# Patient Record
Sex: Male | Born: 1990 | Race: White | Hispanic: No | Marital: Single | Smoking: Never smoker
Health system: Southern US, Community
[De-identification: ages and names within clinical notes are randomized; demographics above are authoritative.]

---

## 2014-08-01 ENCOUNTER — Ambulatory Visit (HOSPITAL_COMMUNITY)
Admission: RE | Admit: 2014-08-01 | Discharge: 2014-08-01 | Disposition: A | Discharge status: Home / Self Care | Payer: 59 | Source: Ambulatory Visit | Attending: Internal Medicine | Admitting: Internal Medicine

## 2014-08-01 ENCOUNTER — Other Ambulatory Visit: Payer: Self-pay | Admitting: Internal Medicine

## 2014-08-01 ENCOUNTER — Other Ambulatory Visit (HOSPITAL_COMMUNITY): Payer: Self-pay | Admitting: Internal Medicine

## 2014-08-01 ENCOUNTER — Ambulatory Visit
Admission: RE | Admit: 2014-08-01 | Discharge: 2014-08-01 | Disposition: A | Discharge status: Home / Self Care | Payer: 59 | Source: Ambulatory Visit | Attending: Internal Medicine | Admitting: Internal Medicine

## 2014-08-01 DIAGNOSIS — R05 Cough: Secondary | ICD-10-CM

## 2014-08-01 DIAGNOSIS — R059 Cough, unspecified: Secondary | ICD-10-CM

## 2014-08-01 DIAGNOSIS — R509 Fever, unspecified: Secondary | ICD-10-CM | POA: Diagnosis not present

## 2014-08-04 ENCOUNTER — Emergency Department (HOSPITAL_COMMUNITY)
Admission: EM | Admit: 2014-08-04 | Discharge: 2014-08-04 | Disposition: A | Discharge status: Home / Self Care | Payer: 59 | Attending: Emergency Medicine | Admitting: Emergency Medicine

## 2014-08-04 ENCOUNTER — Emergency Department (HOSPITAL_COMMUNITY): Payer: 59

## 2014-08-04 ENCOUNTER — Encounter (HOSPITAL_COMMUNITY): Payer: Self-pay | Admitting: Emergency Medicine

## 2014-08-04 DIAGNOSIS — R0602 Shortness of breath: Secondary | ICD-10-CM

## 2014-08-04 DIAGNOSIS — R091 Pleurisy: Secondary | ICD-10-CM | POA: Insufficient documentation

## 2014-08-04 DIAGNOSIS — J189 Pneumonia, unspecified organism: Secondary | ICD-10-CM

## 2014-08-04 DIAGNOSIS — J188 Other pneumonia, unspecified organism: Secondary | ICD-10-CM | POA: Insufficient documentation

## 2014-08-04 LAB — BASIC METABOLIC PANEL
Anion gap: 13 (ref 5–15)
BUN: 27 mg/dL — ABNORMAL HIGH (ref 6–23)
CO2: 27 meq/L (ref 19–32)
Calcium: 9.1 mg/dL (ref 8.4–10.5)
Chloride: 99 mEq/L (ref 96–112)
Creatinine, Ser: 1.63 mg/dL — ABNORMAL HIGH (ref 0.50–1.35)
GFR calc Af Amer: 66 mL/min — ABNORMAL LOW (ref >90)
GFR calc non Af Amer: 57 mL/min — ABNORMAL LOW (ref >90)
GLUCOSE: 122 mg/dL — AB (ref 70–99)
Potassium: 4 mEq/L (ref 3.7–5.3)
Sodium: 139 mEq/L (ref 137–147)

## 2014-08-04 LAB — CBC WITH DIFFERENTIAL/PLATELET
Basophils Absolute: 0 10*3/uL (ref 0.0–0.1)
Basophils Relative: 0 % (ref 0–1)
Eosinophils Absolute: 0.4 10*3/uL (ref 0.0–0.7)
Eosinophils Relative: 4 % (ref 0–5)
HCT: 41.3 % (ref 39.0–52.0)
Hemoglobin: 14.4 g/dL (ref 13.0–17.0)
LYMPHS PCT: 12 % (ref 12–46)
Lymphs Abs: 1.3 10*3/uL (ref 0.7–4.0)
MCH: 29.8 pg (ref 26.0–34.0)
MCHC: 34.9 g/dL (ref 30.0–36.0)
MCV: 85.5 fL (ref 78.0–100.0)
Monocytes Absolute: 1 10*3/uL (ref 0.1–1.0)
Monocytes Relative: 9 % (ref 3–12)
NEUTROS PCT: 75 % (ref 43–77)
Neutro Abs: 7.9 10*3/uL — ABNORMAL HIGH (ref 1.7–7.7)
PLATELETS: 286 10*3/uL (ref 150–400)
RBC: 4.83 MIL/uL (ref 4.22–5.81)
RDW: 11.4 % — ABNORMAL LOW (ref 11.5–15.5)
WBC: 10.5 10*3/uL (ref 4.0–10.5)

## 2014-08-04 MED ORDER — ALBUTEROL SULFATE HFA 108 (90 BASE) MCG/ACT IN AERS: 2.0000 | INHALATION_SPRAY | RESPIRATORY_TRACT | Status: AC | PRN

## 2014-08-04 MED ORDER — ACETAMINOPHEN 500 MG PO TABS
1000.0000 mg | ORAL_TABLET | Freq: Once | ORAL | Status: AC
  Administered 2014-08-04: 1000 mg via ORAL
  Filled 2014-08-04: qty 2

## 2014-08-04 MED ORDER — SODIUM CHLORIDE 0.9 % IV BOLUS (SEPSIS)
1000.0000 mL | Freq: Once | INTRAVENOUS | Status: AC
  Administered 2014-08-04: 1000 mL via INTRAVENOUS

## 2014-08-04 MED ORDER — METHYLPREDNISOLONE SODIUM SUCC 125 MG IJ SOLR
125.0000 mg | Freq: Once | INTRAMUSCULAR | Status: AC
  Administered 2014-08-04: 125 mg via INTRAVENOUS
  Filled 2014-08-04: qty 2

## 2014-08-04 MED ORDER — KETOROLAC TROMETHAMINE 30 MG/ML IJ SOLN
30.0000 mg | Freq: Once | INTRAMUSCULAR | Status: AC
  Administered 2014-08-04: 30 mg via INTRAVENOUS
  Filled 2014-08-04: qty 1

## 2014-08-04 MED ORDER — PREDNISONE 20 MG PO TABS: ORAL_TABLET | ORAL | Status: AC

## 2014-08-04 MED ORDER — HYDROCODONE-HOMATROPINE 5-1.5 MG/5ML PO SYRP: 5.0000 mL | ORAL_SOLUTION | Freq: Four times a day (QID) | ORAL | Status: AC | PRN

## 2014-08-04 NOTE — ED Notes (Signed)
Patient transported to X-ray 

## 2014-08-04 NOTE — ED Notes (Signed)
Patient says it is okay to give his mother information.

## 2014-08-04 NOTE — ED Provider Notes (Signed)
CSN: [id number]     Arrival date & time 08/04/14  0358 History   First MD Initiated Contact with Patient 08/04/14 0415     Chief Complaint  Patient presents with  . Shortness of Breath  . Cough     (Consider location/radiation/quality/duration/timing/severity/associated sxs/prior Treatment) HPI Comments: Patient presents to the ER for evaluation of cough and chest pain. Patient reports that he was diagnosed with pneumonia one week ago. He was treated with Levaquin. Patient reports that overnight his cough has worsened and now he is having sharp stabbing pains in the right chest and rib area. He is having trouble taking deep breaths because of the pain. Pain significantly worsens if he coughs, takes a deep breath or moves.  Patient is a 23 y.o. male presenting with shortness of breath and cough.  Shortness of Breath Associated symptoms: chest pain and cough   Cough Associated symptoms: chest pain and shortness of breath     History reviewed. No pertinent past medical history. History reviewed. No pertinent past surgical history. No family history on file. History  Substance Use Topics  . Smoking status: Never Smoker   . Smokeless tobacco: Not on file  . Alcohol Use: No    Review of Systems  Respiratory: Positive for cough and shortness of breath.   Cardiovascular: Positive for chest pain.  All other systems reviewed and are negative.     Allergies  Review of patient's allergies indicates no known allergies.  Home Medications   Prior to Admission medications   Medication Sig Start Date End Date Taking? Authorizing Provider  ibuprofen (ADVIL,MOTRIN) 200 MG tablet Take 200 mg by mouth every 6 (six) hours as needed.   Yes Historical Provider, MD  levofloxacin (LEVAQUIN) 500 MG tablet Take 500 mg by mouth daily.   Yes Historical Provider, MD  albuterol (PROVENTIL HFA;VENTOLIN HFA) 108 (90 BASE) MCG/ACT inhaler Inhale 2 puffs into the lungs every 4 (four) hours as needed for  wheezing or shortness of breath. 08/04/14   Gilda Creasehristopher J. Baptiste Littler, MD  HYDROcodone-homatropine [facility](HYCODAN) 5-1.5 MG/5ML syrup Take 5 mLs by mouth every 6 (six) hours as needed for cough. 08/04/14   Gilda Creasehristopher J. Raydel Hosick, MD  predniSONE (DELTASONE) 20 MG tablet 3 tabs po daily x 3 days, then 2 tabs x 3 days, then 1.5 tabs x 3 days, then 1 tab x 3 days, then 0.5 tabs x 3 days 08/04/14   Gilda Creasehristopher J. Tarah Buboltz, MD   BP 106/54 mmHg  Pulse 86  Temp(Src) 99.5 F (37.5 C) (Oral)  Resp 20  Ht 5\' 11"  (1.803 m)  Wt 190 lb (86.183 kg)  BMI 26.51 kg/m2  SpO2 97% Physical Exam  Constitutional: He is oriented to person, place, and time. He appears well-developed and well-nourished. No distress.  HENT:  Head: Normocephalic and atraumatic.  Right Ear: Hearing normal.  Left Ear: Hearing normal.  Nose: Nose normal.  Mouth/Throat: Oropharynx is clear and moist and mucous membranes are normal.  Eyes: Conjunctivae and EOM are normal. Pupils are equal, round, and reactive to light.  Neck: Normal range of motion. Neck supple.  Cardiovascular: Regular rhythm, S1 normal and S2 normal.  Exam reveals no gallop and no friction rub.   No murmur heard. Pulmonary/Chest: Effort normal and breath sounds normal. No respiratory distress. He exhibits tenderness. He exhibits no crepitus.    Abdominal: Soft. Normal appearance and bowel sounds are normal. There is no hepatosplenomegaly. There is no tenderness. There is no rebound, no guarding, no tenderness at  McBurney's point and negative Murphy's sign. No hernia.  Musculoskeletal: Normal range of motion.  Neurological: He is alert and oriented to person, place, and time. He has normal strength. No cranial nerve deficit or sensory deficit. Coordination normal. GCS eye subscore is 4. GCS verbal subscore is 5. GCS motor subscore is 6.  Skin: Skin is warm, dry and intact. No rash noted. No cyanosis.  Psychiatric: He has a normal mood and affect. His speech is normal and  behavior is normal. Thought content normal.  Nursing note and vitals reviewed.   ED Course  Procedures (including critical care time) Labs Review Labs Reviewed  CBC WITH DIFFERENTIAL - Abnormal; Notable for the following:    RDW 11.4 (*)    Neutro Abs 7.9 (*)    All other components within normal limits  BASIC METABOLIC PANEL - Abnormal; Notable for the following:    Glucose, Bld 122 (*)    BUN 27 (*)    Creatinine, Ser 1.63 (*)    GFR calc non Af Amer 57 (*)    GFR calc Af Amer 66 (*)    All other components within normal limits    Imaging Review Dg Chest 2 View  08/04/2014   CLINICAL DATA:  Right chest pain.  Fever.  EXAM: CHEST  2 VIEW  COMPARISON:  PA and lateral chest 08/01/2014.  FINDINGS: Right middle lobe airspace disease identified as on the prior study but appears slightly less dense. The left lung is clear. Heart size is normal. No pneumothorax or pleural effusion.  IMPRESSION: Mild improvement in right middle lobe pneumonia. No new abnormality.   Electronically Signed   By: Drusilla Kannerhomas  Dalessio M.D.   On: 08/04/2014 04:55     EKG Interpretation None      MDM   Final diagnoses:  CAP (community acquired pneumonia)  Pleurisy   Patient presents to the ER for evaluation of right-sided chest pain, increasing cough and shortness of breath. Patient was diagnosed by his primary care doctor with pneumonia last week. He is on Levaquin for the pneumonia. Patient reports that he had acute worsening of his pain and cough last night. Patient is febrile at arrival. He reports that he has been experiencing fever every day for the last several days, since he was diagnosed.  Chest x-ray shows improving right middle lobe pneumonia. He appears to be responding to the Levaquin. Pain might be chest wall in nature or possibly pleurisy. He feels much better after Toradol and Solu-Medrol here in the ER. Will continue Levaquin. Add prednisone, albuterol, Hycodan. He has x-ray scheduled for next  week for recheck.    Gilda Creasehristopher J. Ace Bergfeld, MD 08/04/14 [phone]

## 2014-08-04 NOTE — ED Notes (Signed)
Patient has returned from xray 

## 2014-08-04 NOTE — ED Notes (Signed)
Pt. reports SOB with dry cough onset this morning with fever , currently taking oral antibiotic for pneumonia prescribed by his PCP this week .

## 2014-08-04 NOTE — ED Notes (Signed)
Patient is alert and orientedx4.  Patient was explained discharge instructions and they understood them with no questions.   

## 2014-08-04 NOTE — Discharge Instructions (Signed)
Pneumonia  Pneumonia is an infection of the lungs.   CAUSES  Pneumonia may be caused by bacteria or a virus. Usually, these infections are caused by breathing infectious particles into the lungs (respiratory tract).  SIGNS AND SYMPTOMS    Cough.   Fever.   Chest pain.   Increased rate of breathing.   Wheezing.   Mucus production.  DIAGNOSIS   If you have the common symptoms of pneumonia, your health care provider will typically confirm the diagnosis with a chest X-ray. The X-ray will show an abnormality in the lung (pulmonary infiltrate) if you have pneumonia. Other tests of your blood, urine, or sputum may be done to find the specific cause of your pneumonia. Your health care provider may also do tests (blood gases or pulse oximetry) to see how well your lungs are working.  TREATMENT   Some forms of pneumonia may be spread to other people when you cough or sneeze. You may be asked to wear a mask before and during your exam. Pneumonia that is caused by bacteria is treated with antibiotic medicine. Pneumonia that is caused by the influenza virus may be treated with an antiviral medicine. Most other viral infections must run their course. These infections will not respond to antibiotics.   HOME CARE INSTRUCTIONS    Cough suppressants may be used if you are losing too much rest. However, coughing protects you by clearing your lungs. You should avoid using cough suppressants if you can.   Your health care provider may have prescribed medicine if he or she thinks your pneumonia is caused by bacteria or influenza. Finish your medicine even if you start to feel better.   Your health care provider may also prescribe an expectorant. This loosens the mucus to be coughed up.   Take medicines only as directed by your health care provider.   Do not smoke. Smoking is a common cause of bronchitis and can contribute to pneumonia. If you are a smoker and continue to smoke, your cough may last several weeks after your  pneumonia has cleared.   A cold steam vaporizer or humidifier in your room or home may help loosen mucus.   Coughing is often worse at night. Sleeping in a semi-upright position in a recliner or using a couple pillows under your head will help with this.   Get rest as you feel it is needed. Your body will usually let you know when you need to rest.  PREVENTION  A pneumococcal shot (vaccine) is available to prevent a common bacterial cause of pneumonia. This is usually suggested for:   People over 65 years old.   Patients on chemotherapy.   People with chronic lung problems, such as bronchitis or emphysema.   People with immune system problems.  If you are over 65 or have a high risk condition, you may receive the pneumococcal vaccine if you have not received it before. In some countries, a routine influenza vaccine is also recommended. This vaccine can help prevent some cases of pneumonia.You may be offered the influenza vaccine as part of your care.  If you smoke, it is time to quit. You may receive instructions on how to stop smoking. Your health care provider can provide medicines and counseling to help you quit.  SEEK MEDICAL CARE IF:  You have a fever.  SEEK IMMEDIATE MEDICAL CARE IF:    Your illness becomes worse. This is especially true if you are elderly or weakened from any other disease.     You cannot control your cough with suppressants and are losing sleep.   You begin coughing up blood.   You develop pain which is getting worse or is uncontrolled with medicines.   Any of the symptoms which initially brought you in for treatment are getting worse rather than better.   You develop shortness of breath or chest pain.  MAKE SURE YOU:    Understand these instructions.   Will watch your condition.   Will get help right away if you are not doing well or get worse.  Document Released: 09/09/2005 Document Revised: 01/24/2014 Document Reviewed: 11/29/2010  [employer] Patient Information 2015  [employer]. This information is not intended to replace advice given to you by your health care provider. Make sure you discuss any questions you have with your health care provider.    Pleurisy  Pleurisy is an inflammation and swelling of the lining of the lungs (pleura). Because of this inflammation, it hurts to breathe. It can be aggravated by coughing, laughing, or deep breathing. Pleurisy is often caused by an underlying infection or disease.   HOME CARE INSTRUCTIONS   Monitor your pleurisy for any changes. The following actions may help to alleviate any discomfort you are experiencing:   Medicine may help with pain. Only take over-the-counter or prescription medicines for pain, discomfort, or fever as directed by your health care provider.   Only take antibiotic medicine as directed. Make sure to finish it even if you start to feel better.  SEEK MEDICAL CARE IF:    Your pain is not controlled with medicine or is increasing.   You have an increase in pus-like (purulent) secretions brought up with coughing.  SEEK IMMEDIATE MEDICAL CARE IF:    You have blue or dark lips, fingernails, or toenails.   You are coughing up blood.   You have increased difficulty breathing.   You have continuing pain unrelieved by medicine or pain lasting more than 1 week.   You have pain that radiates into your neck, arms, or jaw.   You develop increased shortness of breath or wheezing.   You develop a fever, rash, vomiting, fainting, or other serious symptoms.  MAKE SURE YOU:   Understand these instructions.    Will watch your condition.    Will get help right away if you are not doing well or get worse.    Document Released: 09/09/2005 Document Revised: 05/12/2013 Document Reviewed: 02/21/2013  [employer] Patient Information 2015 [employer]. This information is not intended to replace advice given to you by your health care provider. Make sure you discuss any questions you have with your health care  provider.

## 2014-08-08 ENCOUNTER — Other Ambulatory Visit (HOSPITAL_COMMUNITY): Payer: Self-pay | Admitting: Internal Medicine

## 2014-08-08 ENCOUNTER — Ambulatory Visit (HOSPITAL_COMMUNITY)
Admission: RE | Admit: 2014-08-08 | Discharge: 2014-08-08 | Disposition: A | Discharge status: Home / Self Care | Payer: 59 | Source: Ambulatory Visit | Attending: Internal Medicine | Admitting: Internal Medicine

## 2014-08-08 DIAGNOSIS — J189 Pneumonia, unspecified organism: Secondary | ICD-10-CM

## 2014-08-08 DIAGNOSIS — R05 Cough: Secondary | ICD-10-CM | POA: Diagnosis present

## 2014-08-08 DIAGNOSIS — R0989 Other specified symptoms and signs involving the circulatory and respiratory systems: Secondary | ICD-10-CM | POA: Insufficient documentation

## 2015-12-07 IMAGING — CR DG CHEST 2V
2 series · 2 of 2 positions shown · non-contrast
Comparison: None.

CLINICAL DATA: Fever and cough for 6 days

EXAM:
CHEST  2 VIEW

[w chest pa]
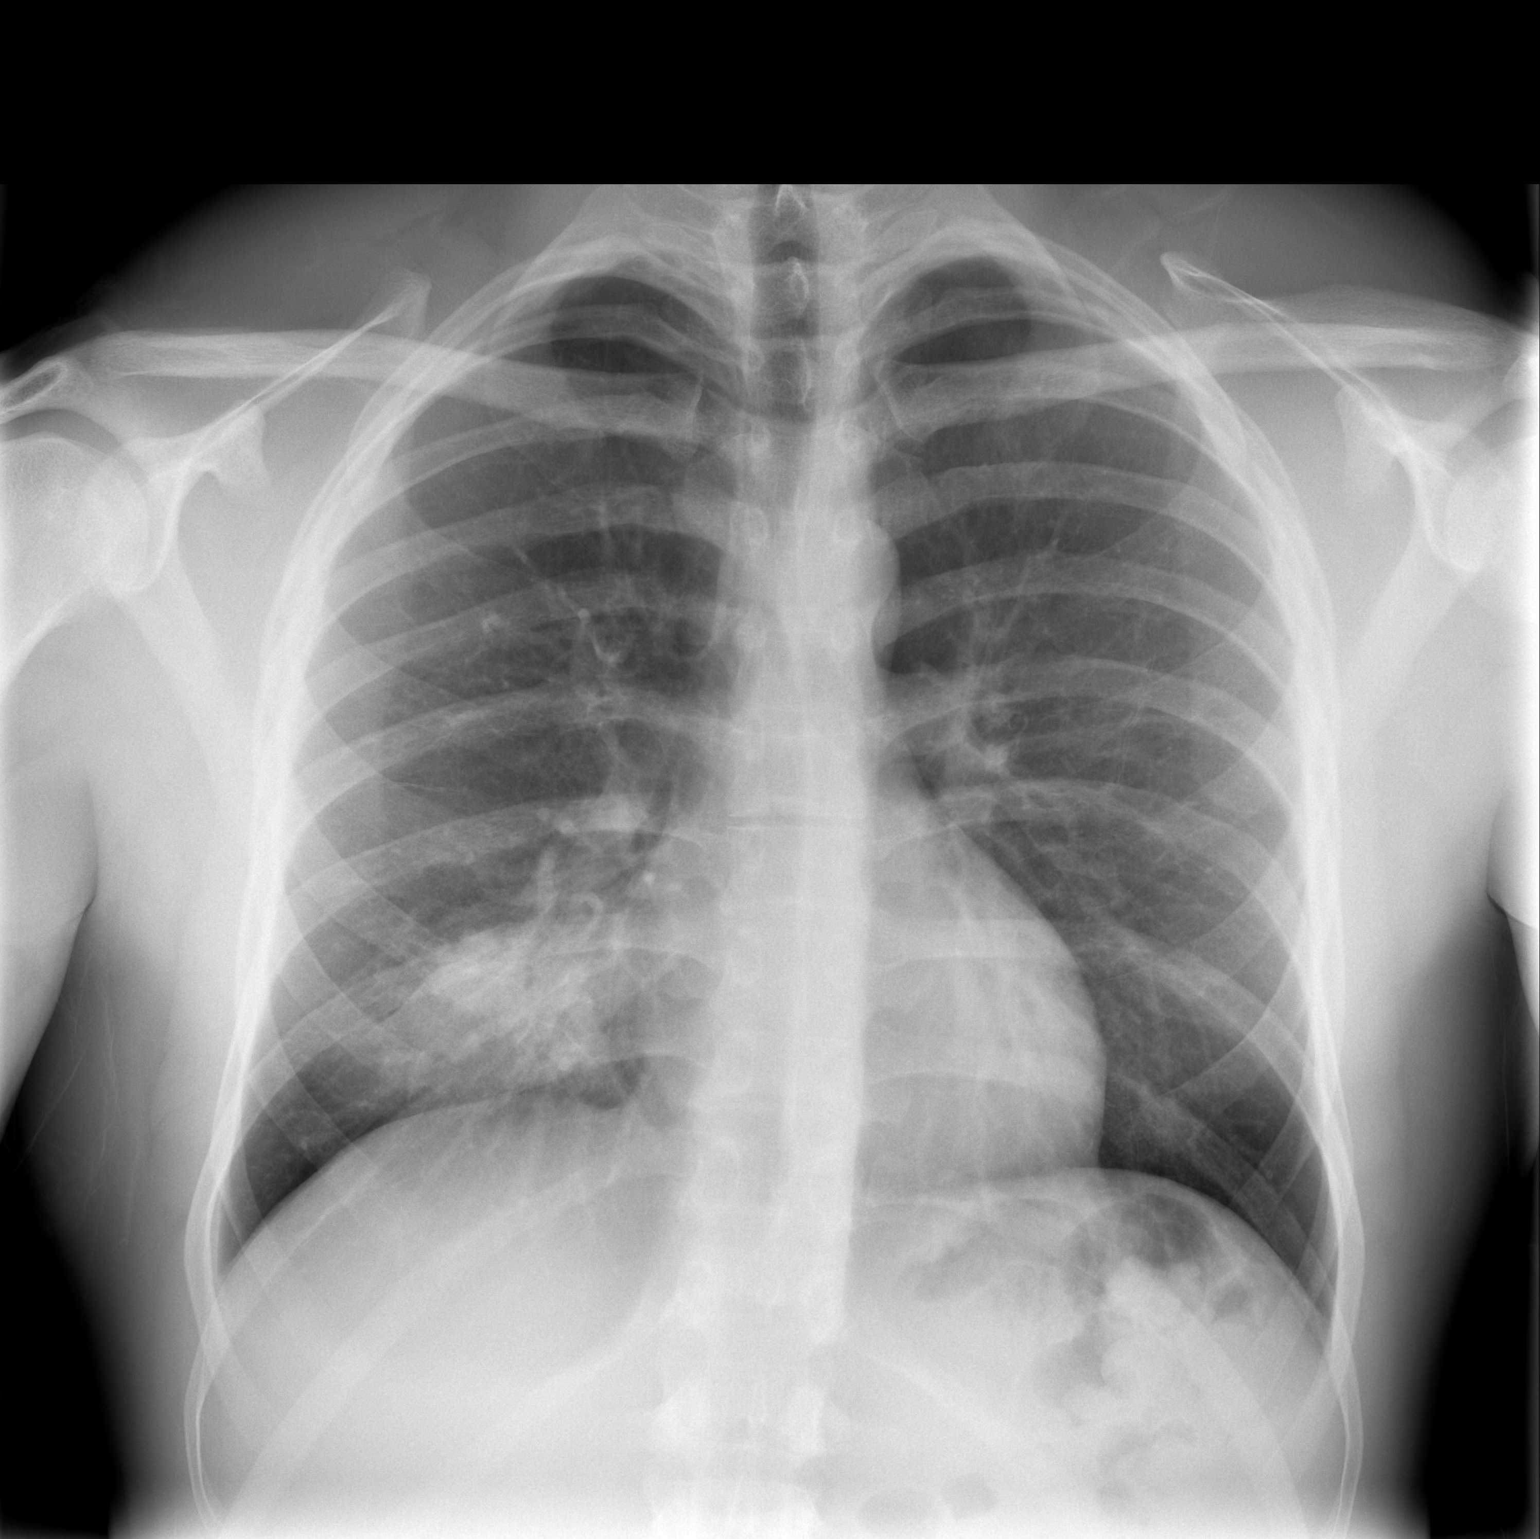

[w chest lat]
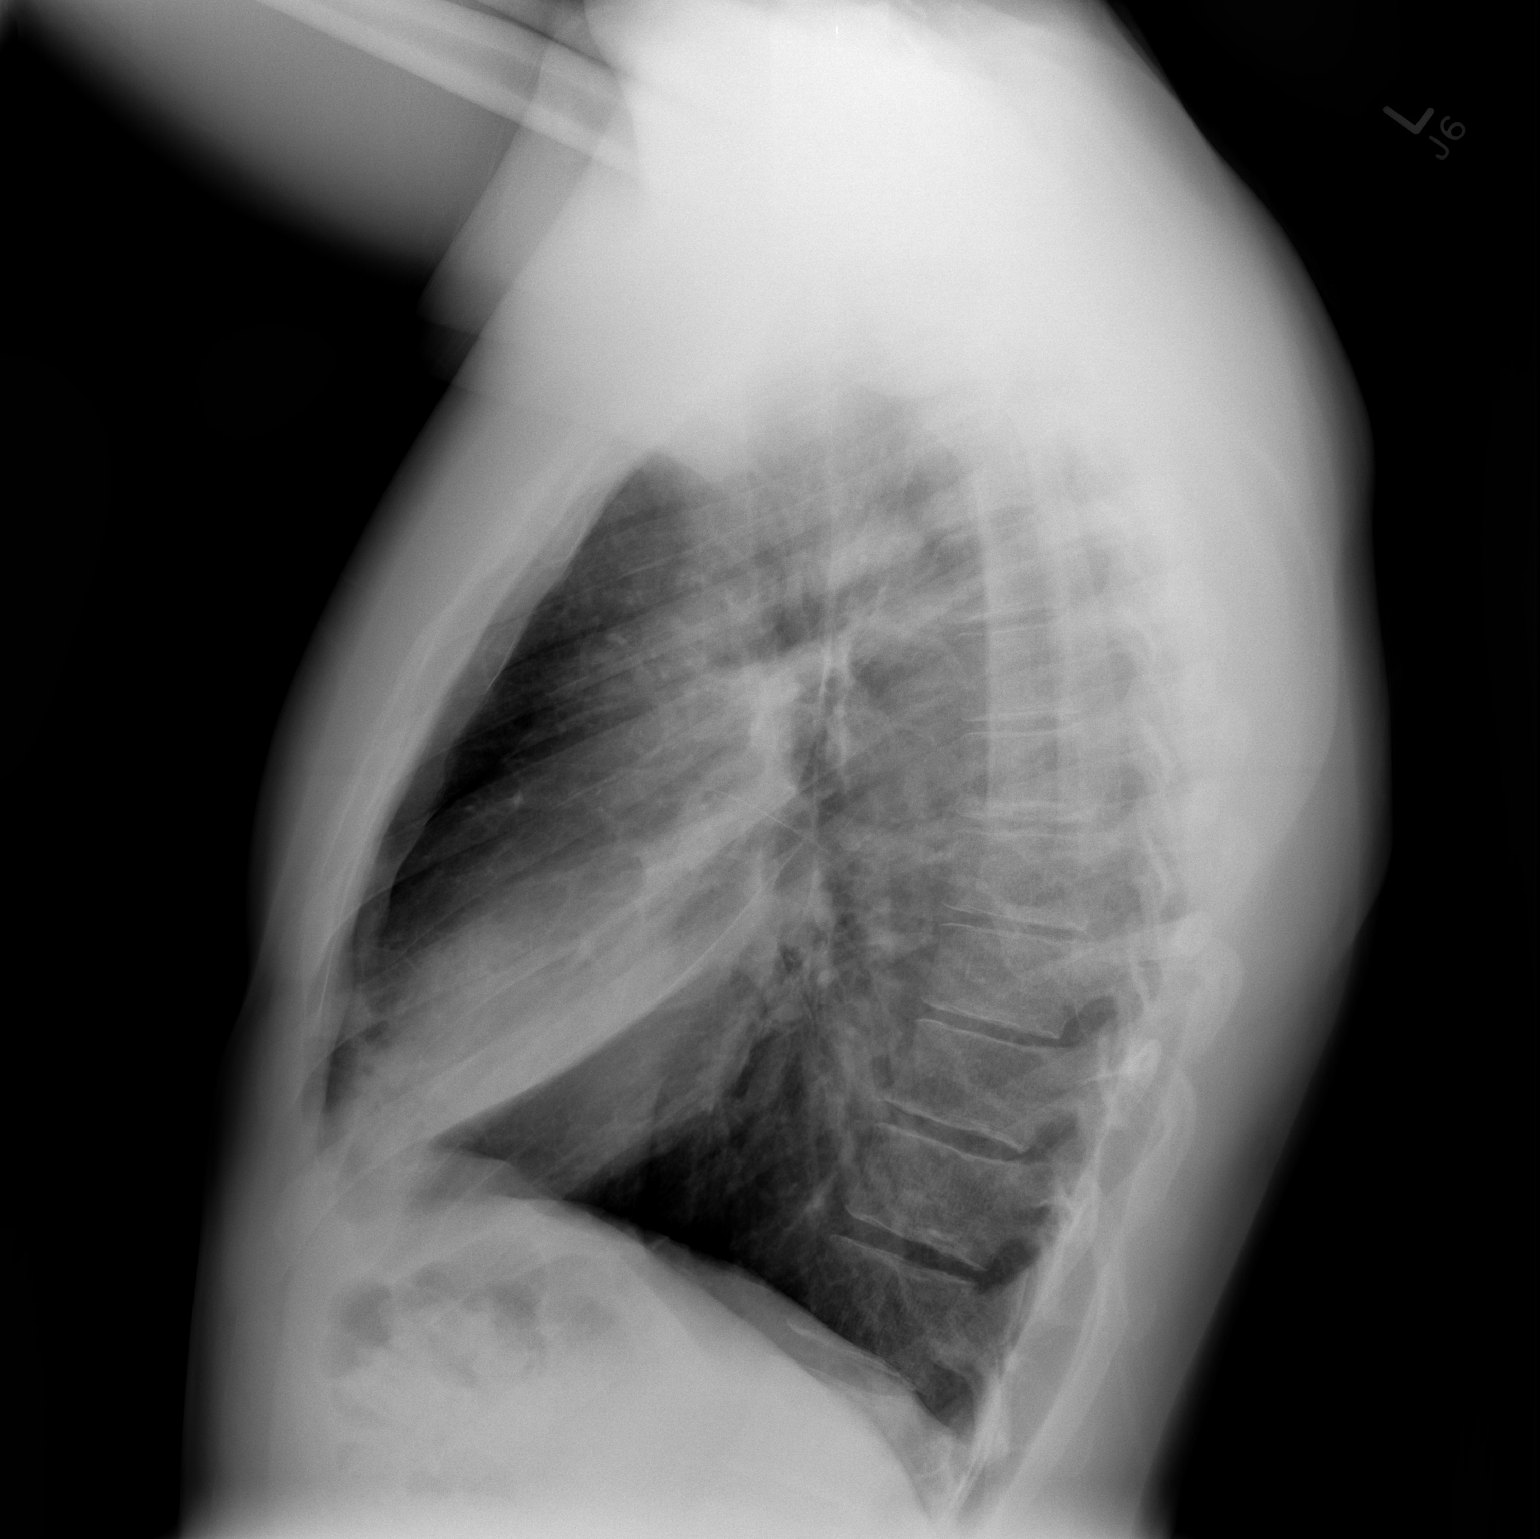

[2 of 2 positions shown; findings below may reference images not displayed]

FINDINGS: There is extensive consolidation in the right middle lobe with
volume loss. Lungs elsewhere clear. Heart size and pulmonary
vascularity are normal. No adenopathy. No bone lesions.
IMPRESSION: Extensive right middle lobe consolidation.  Elsewhere lungs clear.

## 2015-12-10 IMAGING — CR DG CHEST 2V
2 series · 2 of 2 positions shown · non-contrast
Comparison: PA and lateral chest 08/01/2014.

CLINICAL DATA: Right chest pain.  Fever.

EXAM:
CHEST  2 VIEW

[w chest pa]
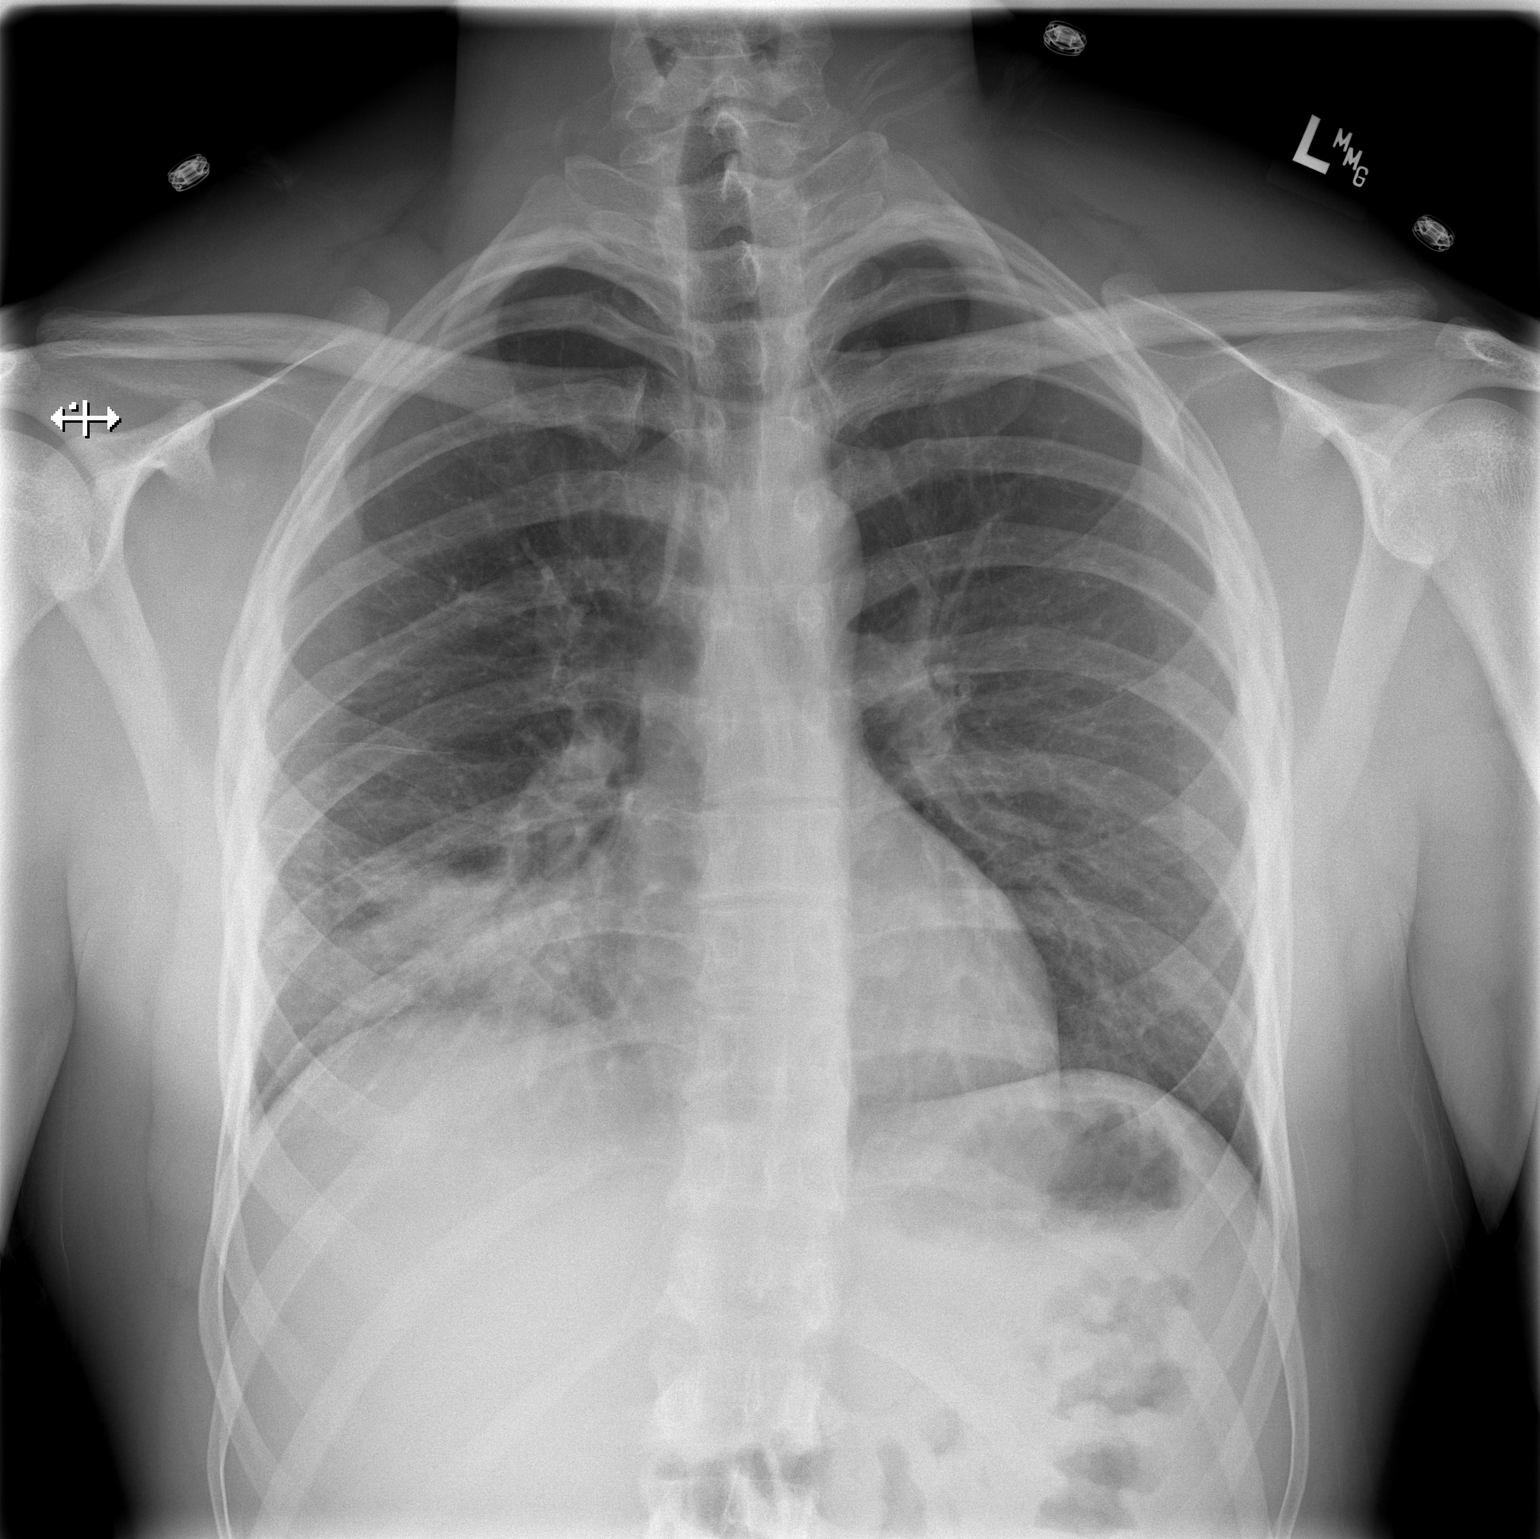

[w chest lat]
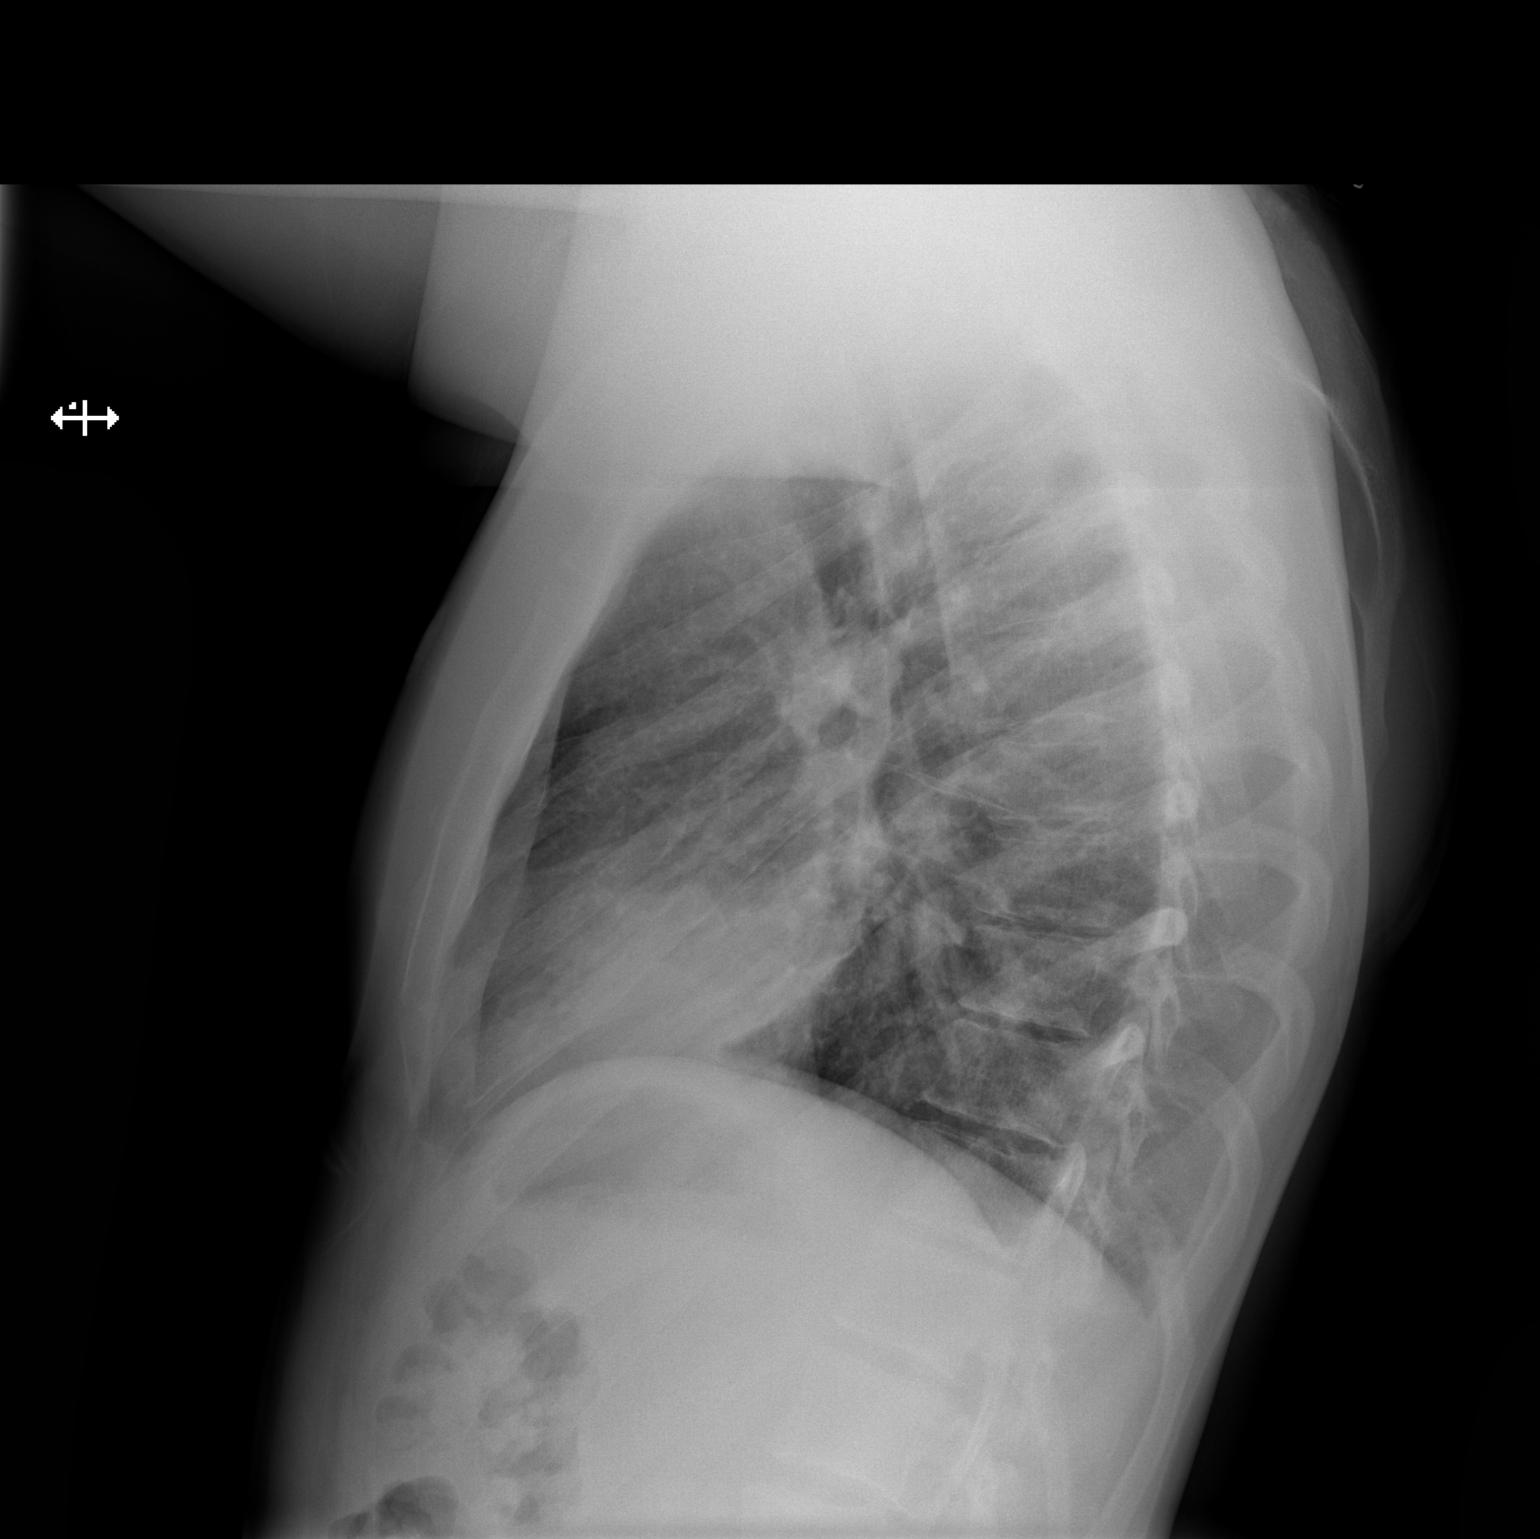

[2 of 2 positions shown; findings below may reference images not displayed]

FINDINGS: Right middle lobe airspace disease identified as on the prior study
but appears slightly less dense. The left lung is clear. Heart size
is normal. No pneumothorax or pleural effusion.
IMPRESSION: Mild improvement in right middle lobe pneumonia. No new abnormality.
